# Patient Record
Sex: Male | Born: 1955
Health system: Southern US, Community
[De-identification: ages and names within clinical notes are randomized; demographics above are authoritative.]

## PROBLEM LIST (undated history)

## (undated) DIAGNOSIS — I714 Abdominal aortic aneurysm, without rupture, unspecified: Secondary | ICD-10-CM

## (undated) DIAGNOSIS — E7800 Pure hypercholesterolemia, unspecified: Secondary | ICD-10-CM

## (undated) DIAGNOSIS — I10 Essential (primary) hypertension: Secondary | ICD-10-CM

## (undated) HISTORY — PX: EYE SURGERY: SHX253

---

## 2015-08-15 ENCOUNTER — Ambulatory Visit
Admission: RE | Admit: 2015-08-15 | Discharge: 2015-08-15 | Disposition: A | Discharge status: Home / Self Care | Payer: 59 | Source: Ambulatory Visit | Attending: Unknown Physician Specialty | Admitting: Unknown Physician Specialty

## 2015-08-15 ENCOUNTER — Other Ambulatory Visit: Payer: Self-pay | Admitting: Unknown Physician Specialty

## 2015-08-15 DIAGNOSIS — K8020 Calculus of gallbladder without cholecystitis without obstruction: Secondary | ICD-10-CM | POA: Diagnosis not present

## 2015-08-15 DIAGNOSIS — R1032 Left lower quadrant pain: Secondary | ICD-10-CM | POA: Diagnosis present

## 2015-08-15 DIAGNOSIS — I714 Abdominal aortic aneurysm, without rupture: Secondary | ICD-10-CM | POA: Insufficient documentation

## 2015-10-29 ENCOUNTER — Emergency Department
Admission: EM | Admit: 2015-10-29 | Discharge: 2015-10-29 | Disposition: A | Discharge status: Home / Self Care | Payer: 59 | Attending: Emergency Medicine | Admitting: Emergency Medicine

## 2015-10-29 ENCOUNTER — Encounter: Payer: Self-pay | Admitting: Emergency Medicine

## 2015-10-29 DIAGNOSIS — R1030 Lower abdominal pain, unspecified: Secondary | ICD-10-CM | POA: Diagnosis not present

## 2015-10-29 DIAGNOSIS — Z87891 Personal history of nicotine dependence: Secondary | ICD-10-CM | POA: Insufficient documentation

## 2015-10-29 DIAGNOSIS — R300 Dysuria: Secondary | ICD-10-CM | POA: Insufficient documentation

## 2015-10-29 DIAGNOSIS — R319 Hematuria, unspecified: Secondary | ICD-10-CM | POA: Insufficient documentation

## 2015-10-29 DIAGNOSIS — R34 Anuria and oliguria: Secondary | ICD-10-CM | POA: Diagnosis not present

## 2015-10-29 DIAGNOSIS — R3915 Urgency of urination: Secondary | ICD-10-CM | POA: Diagnosis not present

## 2015-10-29 DIAGNOSIS — G8918 Other acute postprocedural pain: Secondary | ICD-10-CM | POA: Insufficient documentation

## 2015-10-29 DIAGNOSIS — R3911 Hesitancy of micturition: Secondary | ICD-10-CM | POA: Insufficient documentation

## 2015-10-29 HISTORY — DX: Pure hypercholesterolemia, unspecified: E78.00

## 2015-10-29 LAB — URINALYSIS COMPLETE WITH MICROSCOPIC (ARMC ONLY)
BACTERIA UA: NONE SEEN
Specific Gravity, Urine: 1.028 (ref 1.005–1.030)
pH: UNDETERMINED (ref 5.0–8.0)

## 2015-10-29 MED ORDER — CIPROFLOXACIN HCL 500 MG PO TABS: 500.0000 mg | ORAL_TABLET | Freq: Two times a day (BID) | ORAL | Status: AC

## 2015-10-29 NOTE — ED Notes (Signed)
Patient is POD 1 from hernia repair surgery, during which he required a urinary catheter, which was removed and patient was discharged to home.  This morning patient reports malodorous urine and then experienced hematuria, frequency and burning on urination.

## 2015-10-29 NOTE — Discharge Instructions (Signed)
Dysuria Dysuria is pain or discomfort while urinating. The pain or discomfort may be felt in the tube that carries urine out of the bladder (urethra) or in the surrounding tissue of the genitals. The pain may also be felt in the groin area, lower abdomen, and lower back. You may have to urinate frequently or have the sudden feeling that you have to urinate (urgency). Dysuria can affect both men and women, but is more common in women. Dysuria can be caused by many different things, including:  Urinary tract infection in women.  Infection of the kidney or bladder.  Kidney stones or bladder stones.  Certain sexually transmitted infections (STIs), such as chlamydia.  Dehydration.  Inflammation of the vagina.  Use of certain medicines.  Use of certain soaps or scented products that cause irritation. HOME CARE INSTRUCTIONS Watch your dysuria for any changes. The following actions may help to reduce any discomfort you are feeling:  Drink enough fluid to keep your urine clear or pale yellow.  Empty your bladder often. Avoid holding urine for long periods of time.  After a bowel movement or urination, women should cleanse from front to back, using each tissue only once.  Empty your bladder after sexual intercourse.  Take medicines only as directed by your health care provider.  If you were prescribed an antibiotic medicine, finish it all even if you start to feel better.  Avoid caffeine, tea, and alcohol. They can irritate the bladder and make dysuria worse. In men, alcohol may irritate the prostate.  Keep all follow-up visits as directed by your health care provider. This is important.  If you had any tests done to find the cause of dysuria, it is your responsibility to obtain your test results. Ask the lab or department performing the test when and how you will get your results. Talk with your health care provider if you have any questions about your results. SEEK MEDICAL CARE  IF:  You develop pain in your back or sides.  You have a fever.  You have nausea or vomiting.  You have blood in your urine.  You are not urinating as often as you usually do. SEEK IMMEDIATE MEDICAL CARE IF:  You pain is severe and not relieved with medicines.  You are unable to hold down any fluids.  You or someone else notices a change in your mental function.  You have a rapid heartbeat at rest.  You have shaking or chills.  You feel extremely weak.   This information is not intended to replace advice given to you by your health care provider. Make sure you discuss any questions you have with your health care provider.   Document Released: 06/25/2004 Document Revised: 10/18/2014 Document Reviewed: 05/23/2014 Elsevier Interactive Patient Education 2016 [employer]  Hematuria, Adult Hematuria is blood in your urine. It can be caused by a bladder infection, kidney infection, prostate infection, kidney stone, or cancer of your urinary tract. Infections can usually be treated with medicine, and a kidney stone usually will pass through your urine. If neither of these is the cause of your hematuria, further workup to find out the reason may be needed. It is very important that you tell your health care provider about any blood you see in your urine, even if the blood stops without treatment or happens without causing pain. Blood in your urine that happens and then stops and then happens again can be a symptom of a very serious condition. Also, pain is not a  symptom in the initial stages of many urinary cancers. HOME CARE INSTRUCTIONS   Drink lots of fluid, 3-4 quarts a day. If you have been diagnosed with an infection, cranberry juice is especially recommended, in addition to large amounts of water.  Avoid caffeine, tea, and carbonated beverages because they tend to irritate the bladder.  Avoid alcohol because it may irritate the prostate.  Take all medicines as directed by  your health care provider.  If you were prescribed an antibiotic medicine, finish it all even if you start to feel better.  If you have been diagnosed with a kidney stone, follow your health care provider's instructions regarding straining your urine to catch the stone.  Empty your bladder often. Avoid holding urine for long periods of time.  After a bowel movement, women should cleanse front to back. Use each tissue only once.  Empty your bladder before and after sexual intercourse if you are a male. SEEK MEDICAL CARE IF:  You develop back pain.  You have a fever.  You have a feeling of sickness in your stomach (nausea) or vomiting.  Your symptoms are not better in 3 days. Return sooner if you are getting worse. SEEK IMMEDIATE MEDICAL CARE IF:   You develop severe vomiting and are unable to keep the medicine down.  You develop severe back or abdominal pain despite taking your medicines.  You begin passing a large amount of blood or clots in your urine.  You feel extremely weak or faint, or you pass out. MAKE SURE YOU:   Understand these instructions.  Will watch your condition.  Will get help right away if you are not doing well or get worse.   This information is not intended to replace advice given to you by your health care provider. Make sure you discuss any questions you have with your health care provider.   Document Released: 09/27/2005 Document Revised: 10/18/2014 Document Reviewed: 05/28/2013 Elsevier Interactive Patient Education 2016 [employer].   Please follow up with your PCP within 48 hours to recheck your urine to ensure improvement. Please keep your surgeon informed of your progress.   If you are unable to produce urine, have onset of fevers, chills, pain, please return to the Emergency Department immediately.

## 2015-10-29 NOTE — ED Notes (Signed)
Pt c/o dark red blood in urine.  Pt sts that he had hernia surgery yesterday at [facility], was producing urine w/ catheter.  Pt sts he was having no trouble urinating until this afternoon.  Pt sts that he feels like he has produced v little urine and has had burning w/ urination.

## 2015-10-29 NOTE — ED Provider Notes (Signed)
CSN: [id number]     Arrival date & time 10/29/15  1605 History   First MD Initiated Contact with Patient 10/29/15 1712     Chief Complaint  Patient presents with  . Hematuria     (Consider location/radiation/quality/duration/timing/severity/associated sxs/prior Treatment) HPI Comments: This is a WDWN 60yo male, NAD. He is accompanied by his wife who assists with history. Notes he is post operative day 1 from a hernia repair completed at [facility]. Was catheterized for the surgery and notes removal was without compromise prior to being discharged. Had painful urination with some urgency and hesitancy onset as of 2pm today. Some lower abdominal discomfort. Notes blood in the urine earlier today. Denies penile discharge, skin lesions. No back pain. Is in some discomfort from the surgery. Denies abdominal distension, nausea, vomiting. No fevers, chills, weakness.   Patient is a 60 y.o. male presenting with hematuria. The history is provided by the patient.  Hematuria This is a new problem. The current episode started today. The problem occurs intermittently. Associated symptoms include urinary symptoms. Pertinent negatives include no abdominal pain, chest pain, chills, fever, headaches, nausea, rash or vomiting. Nothing aggravates the symptoms. He has tried nothing for the symptoms.    Past Medical History  Diagnosis Date  . Hypercholesterolemia    Past Surgical History  Procedure Laterality Date  . Eye surgery     History reviewed. No pertinent family history. Social History  Substance Use Topics  . Smoking status: Former Smoker    Quit date: 10/28/2010  . Smokeless tobacco: None  . Alcohol Use: No    Review of Systems  Constitutional: Negative for fever, chills and appetite change.  Respiratory: Negative for chest tightness and shortness of breath.   Cardiovascular: Negative for chest pain and leg swelling.  Gastrointestinal: Negative for nausea, vomiting and abdominal pain.   Genitourinary: Positive for dysuria, urgency, hematuria, decreased urine volume and difficulty urinating. Negative for frequency, flank pain, discharge, penile swelling, genital sores, penile pain and testicular pain.  Musculoskeletal: Negative for back pain.  Skin: Negative for rash.  Neurological: Negative for dizziness and headaches.      Allergies  Penicillins  Home Medications   Prior to Admission medications   Not on File   BP 129/66 mmHg  Pulse 67  Temp(Src) 97.6 F (36.4 C) (Oral)  Resp 18  Ht  (1.676 m)  Wt 76.204 kg  BMI 27.13 kg/m2  SpO2 97% Physical Exam  Constitutional: He appears well-developed and well-nourished. No distress.  HENT:  Head: Normocephalic.  Eyes: Conjunctivae are normal.  Neck: Normal range of motion. Neck supple.  Cardiovascular: Normal rate, regular rhythm and normal heart sounds.  Exam reveals no gallop and no friction rub.   No murmur heard. Pulmonary/Chest: Effort normal and breath sounds normal. No respiratory distress. He has no wheezes. He has no rales.  Abdominal: Soft. Bowel sounds are normal. He exhibits no distension and no mass. There is tenderness (suprapubic tenderness and about surgical port sites) in the suprapubic area. There is no rigidity, no rebound, no guarding and no CVA tenderness.  3 horizontal surgical wounds on the abdomen, well healing without erythema, warmth - mild tenderness to palpation  Lymphadenopathy:    He has no cervical adenopathy.  Neurological: He is alert.  Skin: Skin is warm and dry.  Psychiatric: He has a normal mood and affect.    ED Course  Procedures (including critical care time) Labs Review Labs Reviewed  URINALYSIS COMPLETEWITH MICROSCOPIC [facility] ONLY) -  Abnormal; Notable for the following:    Color, Urine AMBER (*)    APPearance CLOUDY (*)    Glucose, UA   (*)    Value: TEST NOT REPORTED DUE TO COLOR INTERFERENCE OF URINE PIGMENT   Bilirubin Urine   (*)    Value: TEST NOT  REPORTED DUE TO COLOR INTERFERENCE OF URINE PIGMENT   Ketones, ur   (*)    Value: TEST NOT REPORTED DUE TO COLOR INTERFERENCE OF URINE PIGMENT   Hgb urine dipstick   (*)    Value: TEST NOT REPORTED DUE TO COLOR INTERFERENCE OF URINE PIGMENT   Protein, ur   (*)    Value: TEST NOT REPORTED DUE TO COLOR INTERFERENCE OF URINE PIGMENT   Nitrite   (*)    Value: TEST NOT REPORTED DUE TO COLOR INTERFERENCE OF URINE PIGMENT   Leukocytes, UA   (*)    Value: TEST NOT REPORTED DUE TO COLOR INTERFERENCE OF URINE PIGMENT   Squamous Epithelial / LPF 0-5 (*)    All other components within normal limits  URINE CULTURE    Imaging Review Bladder scan completed in triage prior to seeing the patient was noted to be ZERO after voiding. Melonie Florida, PA-C 10/29/15 1747  Governor Rooks, MD 10/29/15 2219

## 2015-11-01 LAB — URINE CULTURE: Culture: >=100000

## 2016-04-30 IMAGING — CT CT ABD-PELV W/O CM
2 of 4 series · 16 of 46 positions shown, 18 images · non-contrast
Comparison: None.

CLINICAL DATA: Left lower quadrant abdominal pain and left flank
pain.

EXAM:
CT ABDOMEN AND PELVIS WITHOUT CONTRAST
TECHNIQUE: Multidetector CT imaging of the abdomen and pelvis was performed
following the standard protocol without IV contrast.

[Series 2: routine without · axial · non-contrast · 0.72mm/px · z∈[-1088,-668]mm · 13 of 92 slices shown, 15 images]
[im 4/92  soft-tissue]
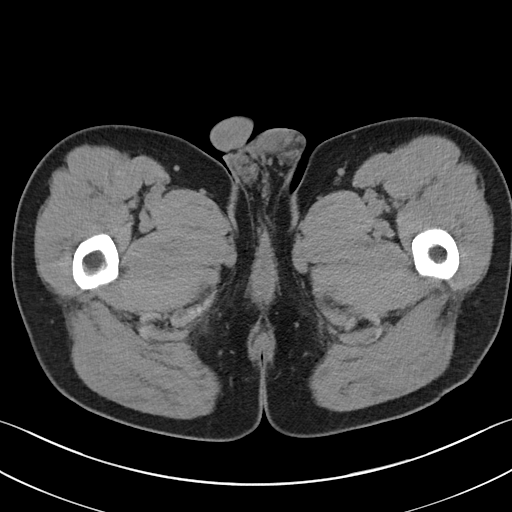
[im 4/92  bone]
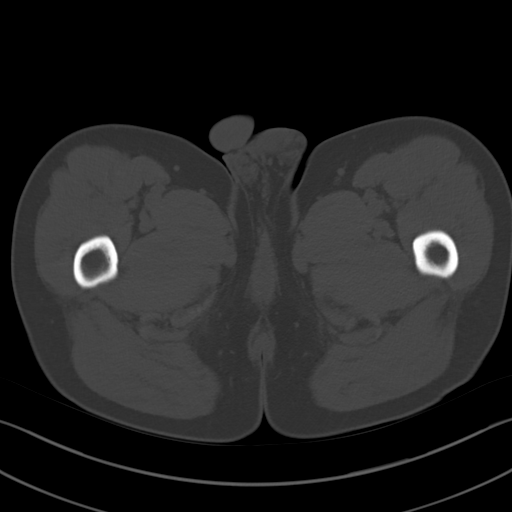
[im 11/92  soft-tissue]
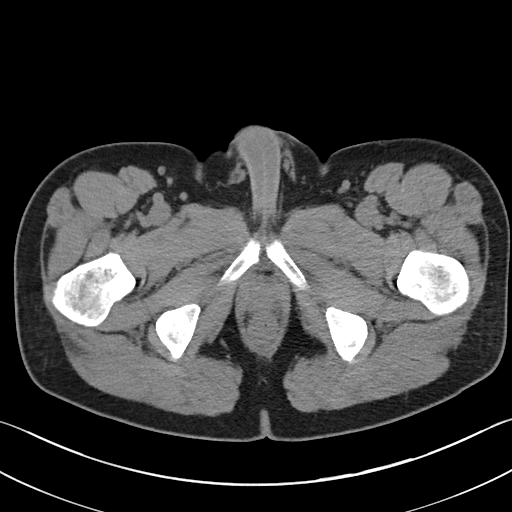
[im 19/92  soft-tissue]
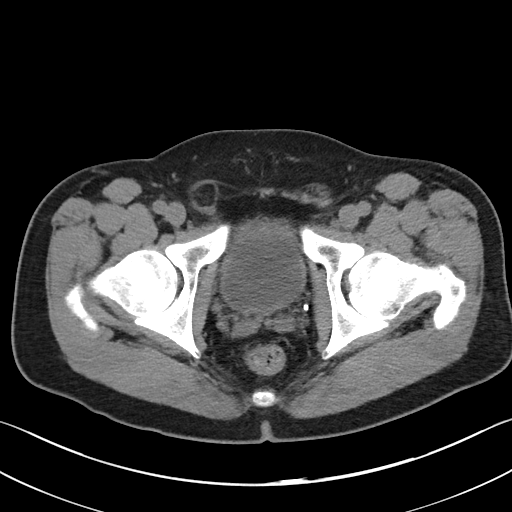
[im 26/92  soft-tissue]
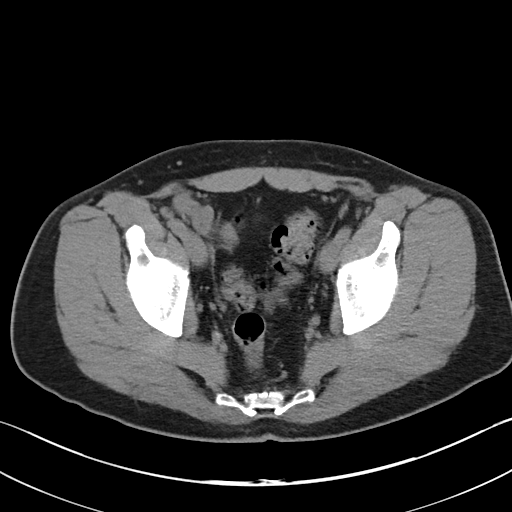
[im 33/92  soft-tissue]
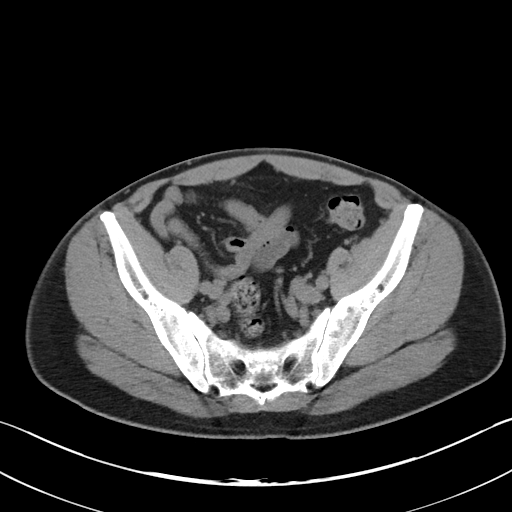
[im 41/92  soft-tissue]
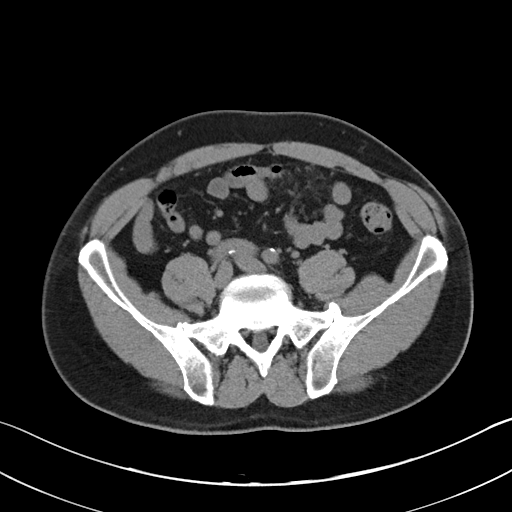
[im 48/92  soft-tissue]
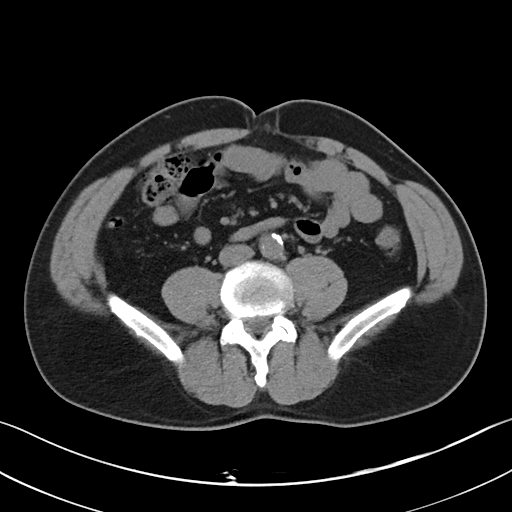
[im 51/92  soft-tissue]
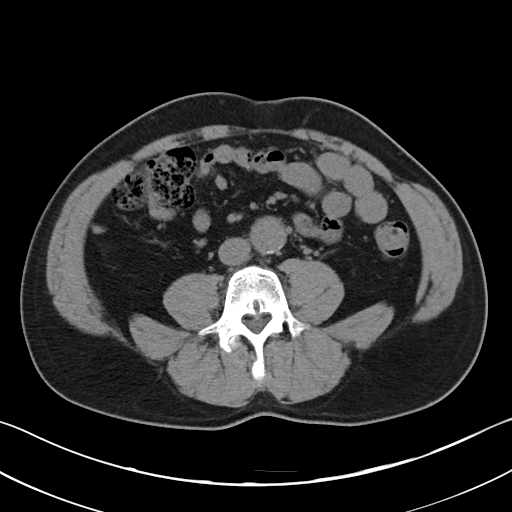
[im 59/92  soft-tissue]
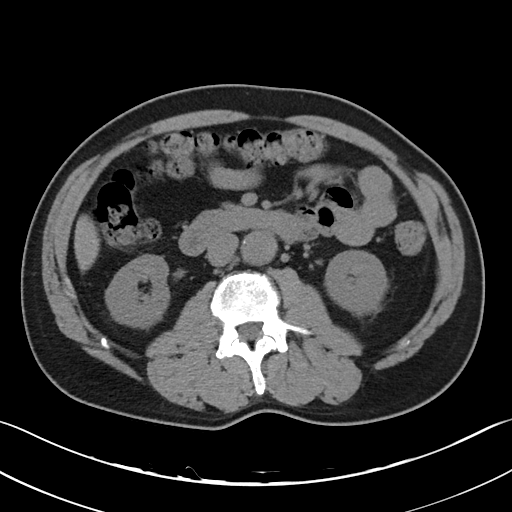
[im 59/92  bone]
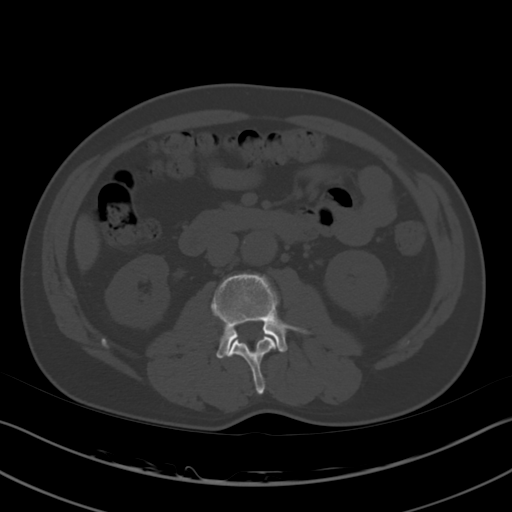
[im 66/92  soft-tissue]
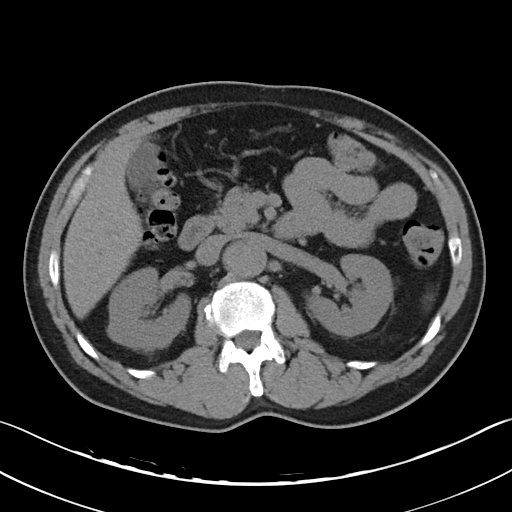
[im 73/92  soft-tissue]
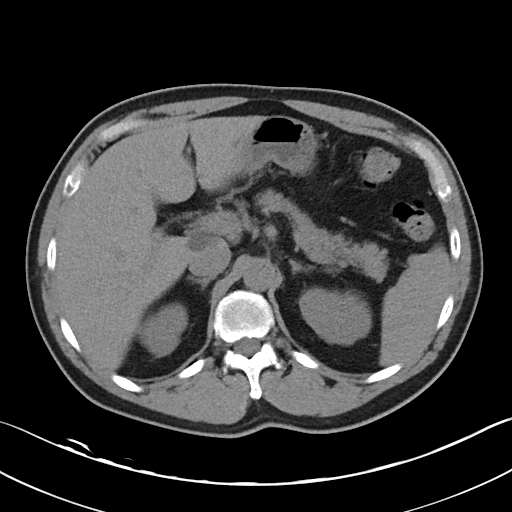
[im 81/92  soft-tissue]
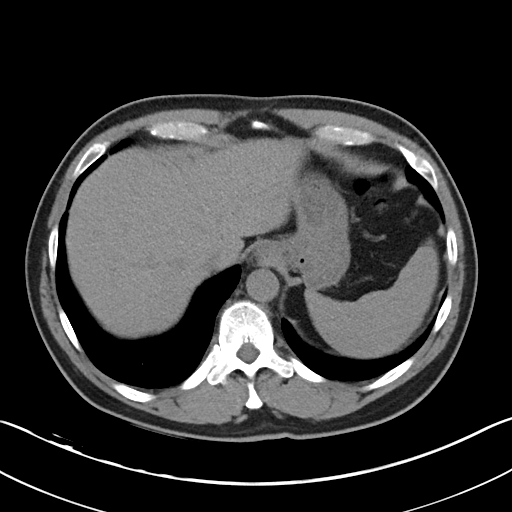
[im 88/92  soft-tissue]
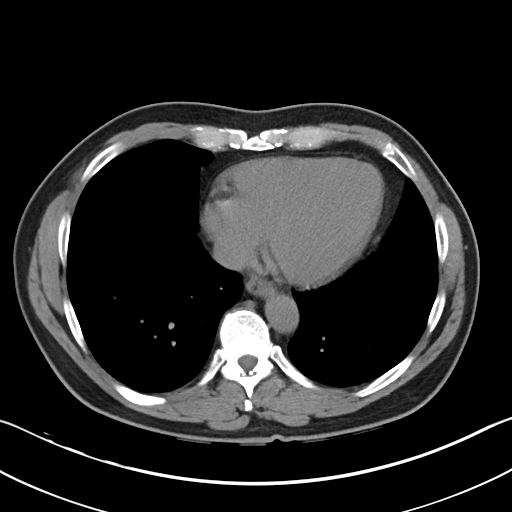

[Series 5: cor routine without · coronal · non-contrast · 0.72mm/px · 3 of 133 slices shown]
[im 45/133  soft-tissue]
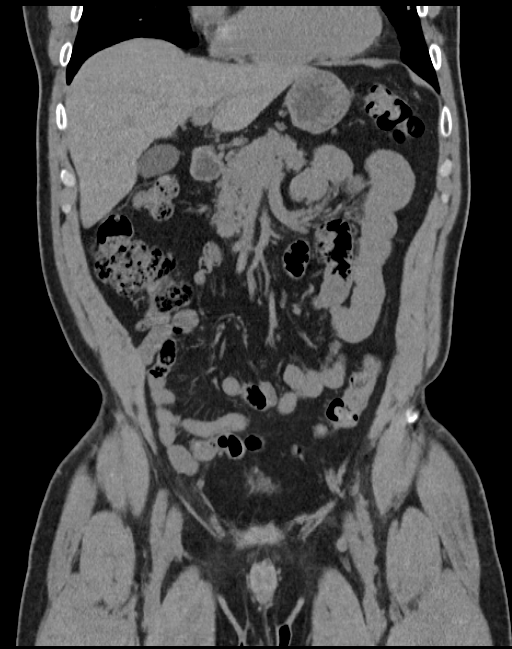
[im 59/133  soft-tissue]
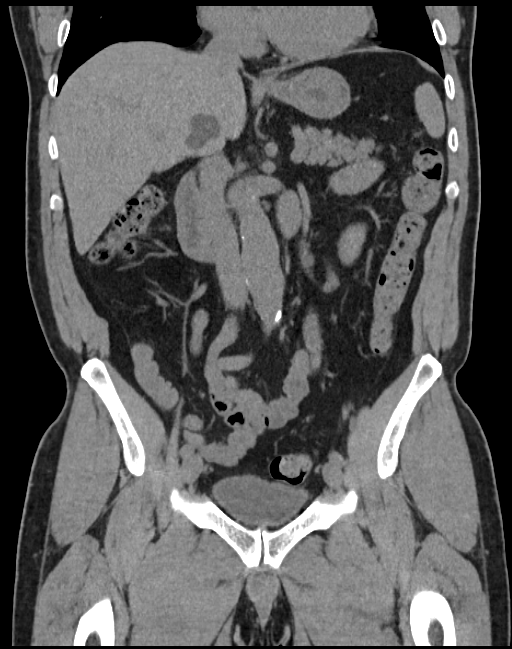
[im 74/133  soft-tissue]
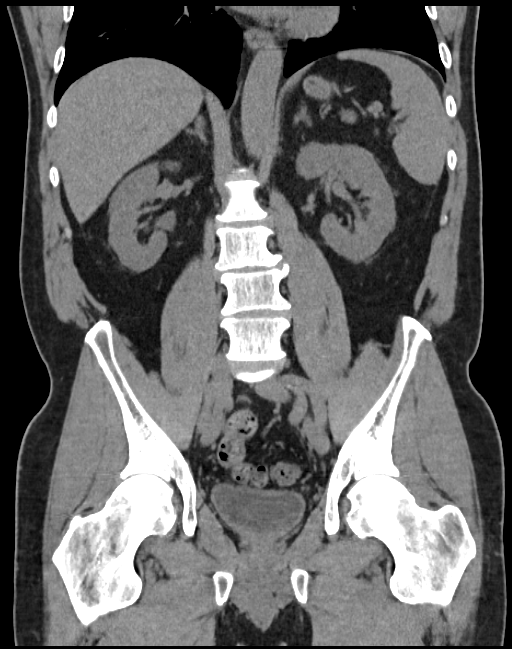

[16 of 46 positions shown; findings below may reference images not displayed]

FINDINGS: No evidence of urinary tract calculi or hydronephrosis. The bladder
is unremarkable. A single calcified gallstone is present in the
gallbladder lumen. No associated gallbladder distention or
surrounding inflammation. Bile ducts are nondilated. Unenhanced
appearance of the liver, pancreas, spleen, adrenal glands and
kidneys are unremarkable.

Bowel shows no evidence of obstruction or inflammation. The appendix
is normal. Sigmoid colon diverticulosis noted without
diverticulitis. No free fluid or abscess identified. Small
right-sided inguinal hernia present containing fat. Mild
degenerative disc disease identified of the lumbar spine.

Mild aneurysmal disease of the abdominal aorta is present. Initial
aneurysmal dilatation begins immediately below the renal arteries
and is slightly eccentric towards the right with maximal caliber of
approximately 3.1 x 3.2 cm. The distal aorta just below the origin
of the inferior mesenteric artery measures 3.4 cm in greatest AP
diameter and 3.0 cm in transverse width. The iliac arteries are
tortuous bilaterally.
IMPRESSION: 1. No evidence of urinary tract calculi or hydronephrosis.
2. Cholelithiasis with single calcified gallstone identified. No
evidence of cholecystitis or biliary obstruction by CT.
3. Mild aneurysmal disease of the infrarenal abdominal aorta with 2
areas of aneurysmal dilatation identified. Maximal diameter of the
aneurysmal aorta is 3.4 cm. Vascular surgery follow-up is
recommended. Recommend followup by ultrasound in 2 years. This
recommendation follows ACR consensus guidelines: White Paper of the
ACR Incidental Findings Committee II on Vascular Findings. [HOSPITAL] 3433; [DATE].

## 2017-01-05 DIAGNOSIS — E7800 Pure hypercholesterolemia, unspecified: Secondary | ICD-10-CM | POA: Diagnosis not present

## 2017-01-05 DIAGNOSIS — Z8249 Family history of ischemic heart disease and other diseases of the circulatory system: Secondary | ICD-10-CM | POA: Diagnosis not present

## 2017-04-22 DIAGNOSIS — L255 Unspecified contact dermatitis due to plants, except food: Secondary | ICD-10-CM | POA: Diagnosis not present

## 2017-04-26 DIAGNOSIS — L259 Unspecified contact dermatitis, unspecified cause: Secondary | ICD-10-CM | POA: Diagnosis not present

## 2017-08-26 DIAGNOSIS — E7800 Pure hypercholesterolemia, unspecified: Secondary | ICD-10-CM | POA: Diagnosis not present

## 2017-08-26 DIAGNOSIS — Z0000 Encounter for general adult medical examination without abnormal findings: Secondary | ICD-10-CM | POA: Diagnosis not present

## 2018-07-31 ENCOUNTER — Other Ambulatory Visit: Payer: Self-pay | Admitting: Physician Assistant

## 2018-07-31 DIAGNOSIS — I7143 Infrarenal abdominal aortic aneurysm, without rupture: Secondary | ICD-10-CM

## 2018-07-31 DIAGNOSIS — I714 Abdominal aortic aneurysm, without rupture: Secondary | ICD-10-CM

## 2018-08-22 ENCOUNTER — Ambulatory Visit
Admission: RE | Admit: 2018-08-22 | Discharge: 2018-08-22 | Disposition: A | Discharge status: Home / Self Care | Payer: Managed Care, Other (non HMO) | Source: Ambulatory Visit | Attending: Physician Assistant | Admitting: Physician Assistant

## 2018-08-22 DIAGNOSIS — I7143 Infrarenal abdominal aortic aneurysm, without rupture: Secondary | ICD-10-CM

## 2018-08-22 DIAGNOSIS — I714 Abdominal aortic aneurysm, without rupture: Secondary | ICD-10-CM

## 2018-10-12 DIAGNOSIS — M25531 Pain in right wrist: Secondary | ICD-10-CM | POA: Diagnosis not present

## 2018-10-12 DIAGNOSIS — Z4789 Encounter for other orthopedic aftercare: Secondary | ICD-10-CM | POA: Diagnosis not present

## 2018-10-12 DIAGNOSIS — M79641 Pain in right hand: Secondary | ICD-10-CM | POA: Diagnosis not present

## 2018-10-23 DIAGNOSIS — M65841 Other synovitis and tenosynovitis, right hand: Secondary | ICD-10-CM | POA: Diagnosis not present

## 2018-10-23 DIAGNOSIS — W11XXXD Fall on and from ladder, subsequent encounter: Secondary | ICD-10-CM | POA: Diagnosis not present

## 2018-10-23 DIAGNOSIS — S66811D Strain of other specified muscles, fascia and tendons at wrist and hand level, right hand, subsequent encounter: Secondary | ICD-10-CM | POA: Diagnosis not present

## 2018-10-26 DIAGNOSIS — Y92009 Unspecified place in unspecified non-institutional (private) residence as the place of occurrence of the external cause: Secondary | ICD-10-CM | POA: Diagnosis not present

## 2018-10-26 DIAGNOSIS — S66811D Strain of other specified muscles, fascia and tendons at wrist and hand level, right hand, subsequent encounter: Secondary | ICD-10-CM | POA: Diagnosis not present

## 2018-10-26 DIAGNOSIS — W11XXXD Fall on and from ladder, subsequent encounter: Secondary | ICD-10-CM | POA: Diagnosis not present

## 2018-11-15 DIAGNOSIS — M79641 Pain in right hand: Secondary | ICD-10-CM | POA: Diagnosis not present

## 2018-11-15 DIAGNOSIS — M25531 Pain in right wrist: Secondary | ICD-10-CM | POA: Diagnosis not present

## 2018-11-15 DIAGNOSIS — Z4789 Encounter for other orthopedic aftercare: Secondary | ICD-10-CM | POA: Diagnosis not present

## 2018-12-14 DIAGNOSIS — M25531 Pain in right wrist: Secondary | ICD-10-CM | POA: Diagnosis not present

## 2018-12-14 DIAGNOSIS — M79641 Pain in right hand: Secondary | ICD-10-CM | POA: Diagnosis not present

## 2018-12-14 DIAGNOSIS — Z4789 Encounter for other orthopedic aftercare: Secondary | ICD-10-CM | POA: Diagnosis not present

## 2018-12-26 DIAGNOSIS — S66811D Strain of other specified muscles, fascia and tendons at wrist and hand level, right hand, subsequent encounter: Secondary | ICD-10-CM | POA: Diagnosis not present

## 2018-12-26 DIAGNOSIS — M65331 Trigger finger, right middle finger: Secondary | ICD-10-CM | POA: Diagnosis not present

## 2018-12-26 DIAGNOSIS — W11XXXD Fall on and from ladder, subsequent encounter: Secondary | ICD-10-CM | POA: Diagnosis not present

## 2019-01-18 IMAGING — US US AORTA SCREENING (MEDICARE)
1 series · 14 of 25 positions shown · non-contrast
Comparison: 08/15/2015

CLINICAL DATA: Abdominal aortic aneurysm

EXAM:
ULTRASOUND OF ABDOMINAL AORTA
TECHNIQUE: Ultrasound examination of the abdominal aorta was performed to
evaluate for abdominal aortic aneurysm.

[Series 1: us aorta screening (medicare) · 0.26mm/px · 14 of 26 slices shown]
[im 1/26]
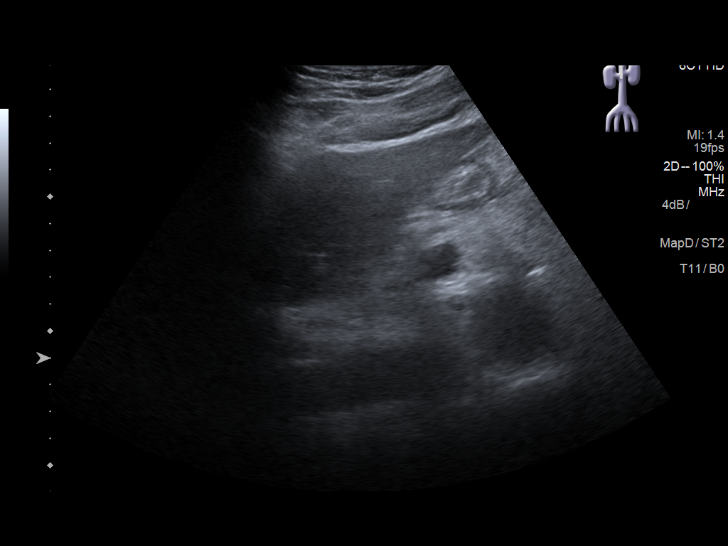
[im 3/26]
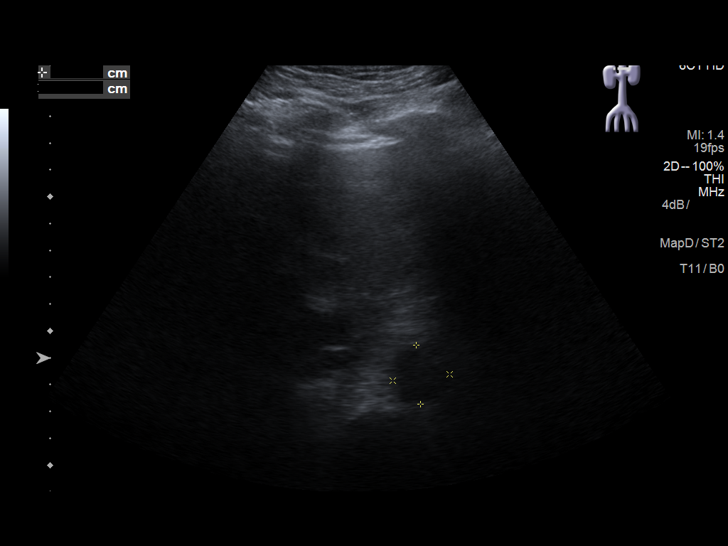
[im 5/26]
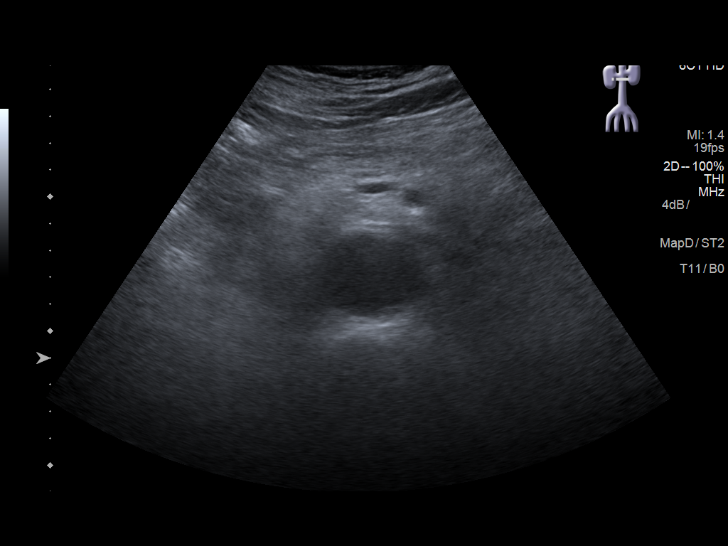
[im 7/26]
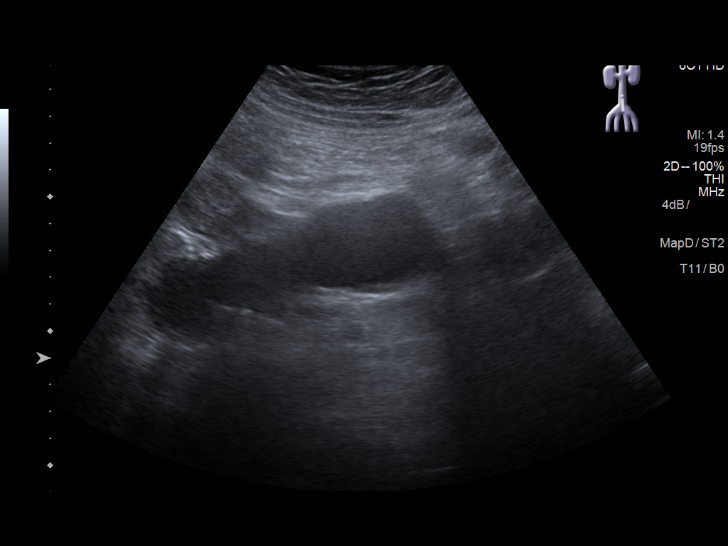
[im 9/26]
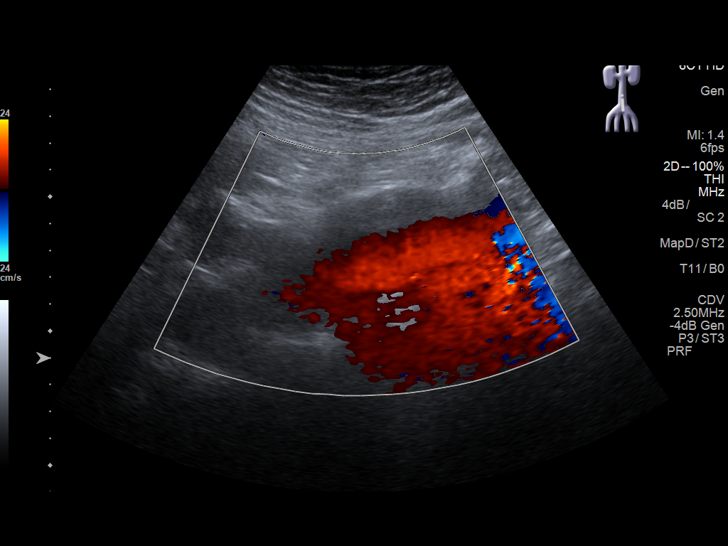
[im 10/26]
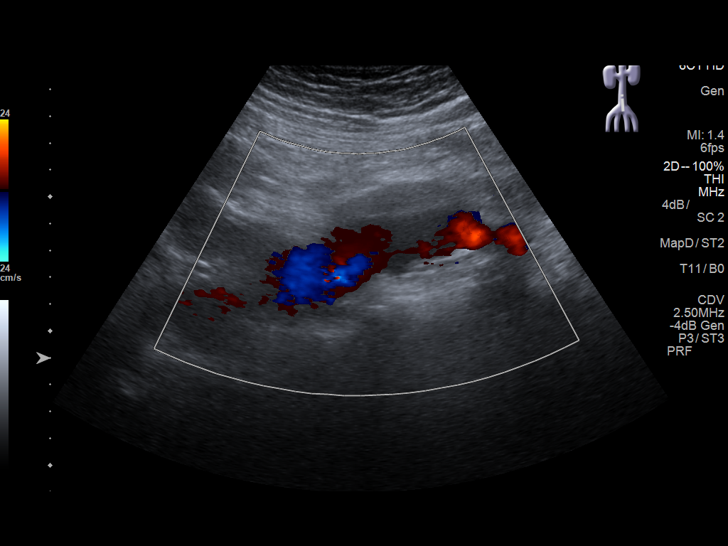
[im 12/26]
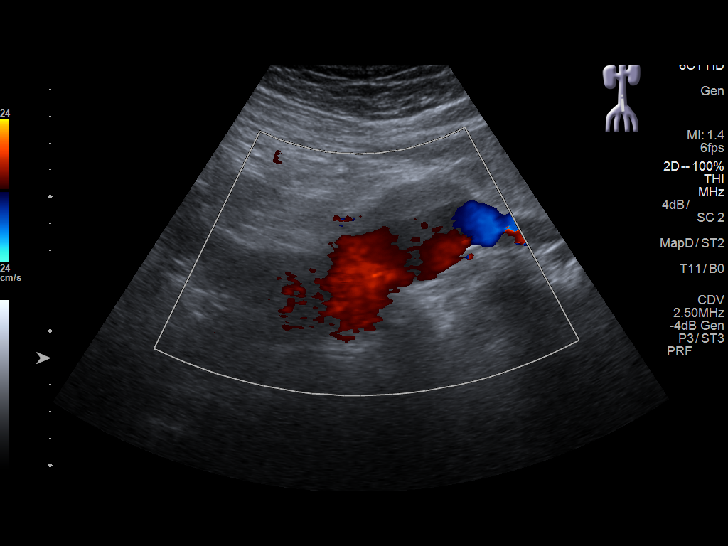
[im 14/26]
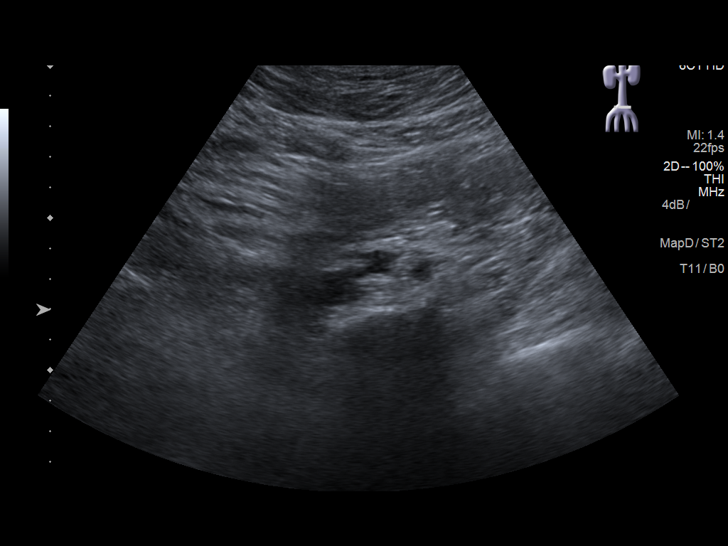
[im 16/26]
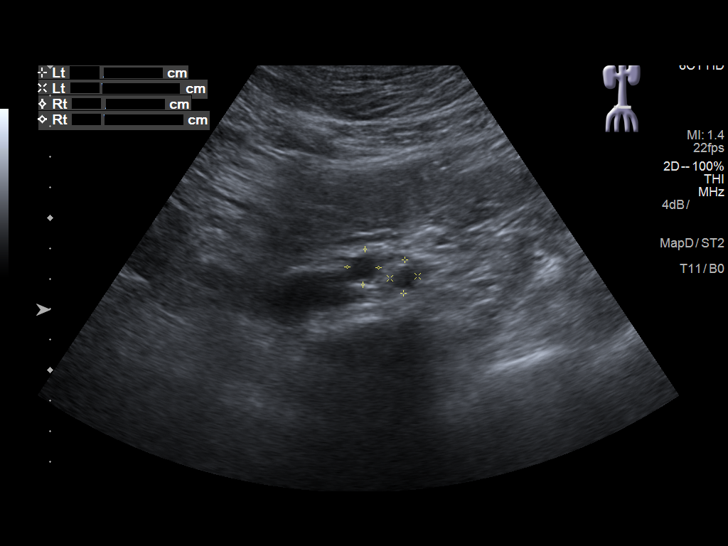
[im 17/26]
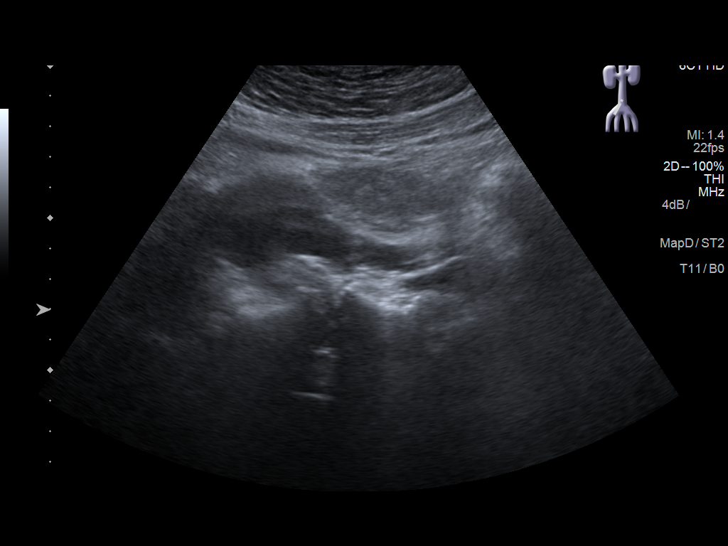
[im 19/26]
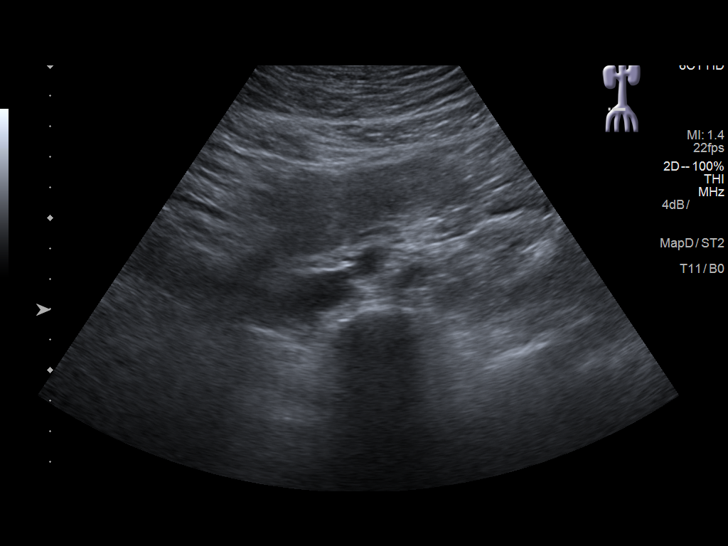
[im 21/26]
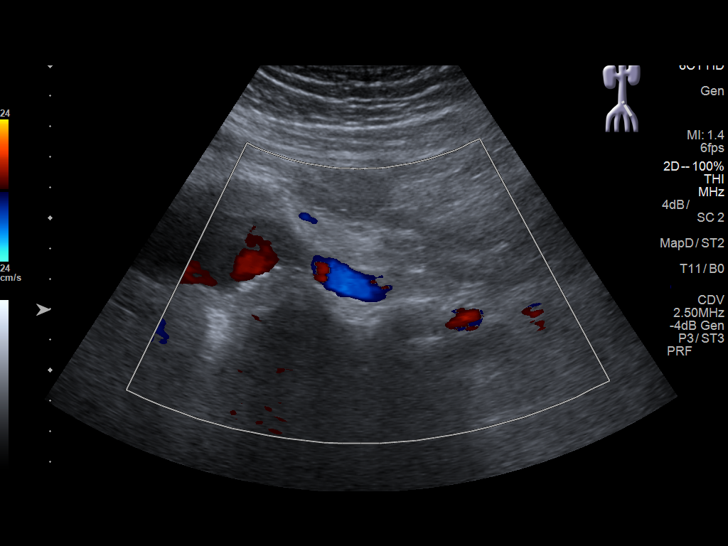
[im 23/26]
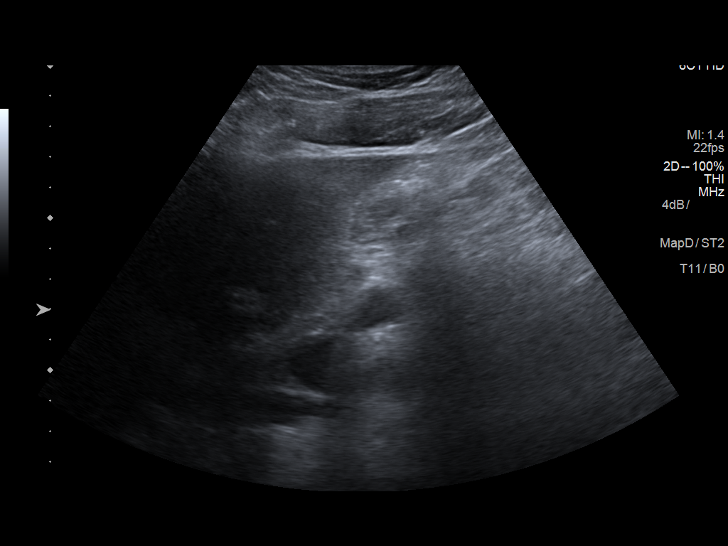
[im 26/26]
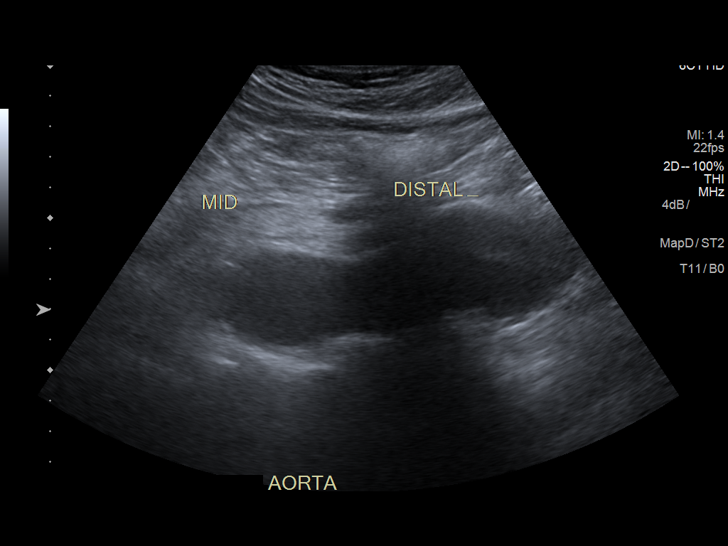

[14 of 25 positions shown; findings below may reference images not displayed]

FINDINGS: Abdominal aortic measurements as follows:

Proximal:  2.2 cm

Mid:  4.0 cm

Distal:  4.1 cm

Lobulated fusiform atherosclerotic aneurysm of the abdominal aorta
measuring 4.1 cm distally. Aneurysm extends to the bifurcation.
Iliac vessels are not involved.

Right iliac 1.2 cm

Left iliac 1.1 cm

Distal aorta velocity 57.3 centimeters/second
IMPRESSION: Lobulated fusiform atherosclerotic abdominal aortic aneurysm
measuring up to 4.1 cm distally.

Recommend followup by US in 1 year. This recommendation follows ACR
consensus guidelines: White Paper of the ACR Incidental Findings
Committee II on Vascular Findings. [HOSPITAL] 7562;

## 2019-01-24 DIAGNOSIS — E7800 Pure hypercholesterolemia, unspecified: Secondary | ICD-10-CM | POA: Diagnosis not present

## 2019-01-24 DIAGNOSIS — I714 Abdominal aortic aneurysm, without rupture: Secondary | ICD-10-CM | POA: Diagnosis not present

## 2019-02-02 DIAGNOSIS — I714 Abdominal aortic aneurysm, without rupture: Secondary | ICD-10-CM | POA: Diagnosis not present

## 2019-07-24 ENCOUNTER — Other Ambulatory Visit: Payer: Self-pay | Admitting: Family Medicine

## 2019-07-24 DIAGNOSIS — I714 Abdominal aortic aneurysm, without rupture, unspecified: Secondary | ICD-10-CM

## 2019-08-01 ENCOUNTER — Ambulatory Visit
Admission: RE | Admit: 2019-08-01 | Discharge: 2019-08-01 | Disposition: A | Discharge status: Home / Self Care | Payer: 59 | Source: Ambulatory Visit | Attending: Family Medicine | Admitting: Family Medicine

## 2019-08-01 ENCOUNTER — Other Ambulatory Visit: Payer: Self-pay

## 2019-08-01 ENCOUNTER — Ambulatory Visit: Payer: Managed Care, Other (non HMO)

## 2019-08-01 DIAGNOSIS — I714 Abdominal aortic aneurysm, without rupture, unspecified: Secondary | ICD-10-CM

## 2020-04-16 IMAGING — US US AORTA SCREENING (MEDICARE)
1 series · 13 of 25 positions shown · non-contrast
Comparison: Abdominal aortic ultrasound-08/22/2018; CT abdomen and
pelvis-08/15/2015

CLINICAL DATA: Aneurysm of the infrarenal abdominal aorta.

EXAM:
US ABDOMINAL AORTA MEDICARE SCREENING
TECHNIQUE: Ultrasound examination of the abdominal aorta was performed as a
screening evaluation for abdominal aortic aneurysm.

[Series 1: us aorta screening (medicare) · 0.26mm/px · 13 of 27 slices shown]
[im 1/27]
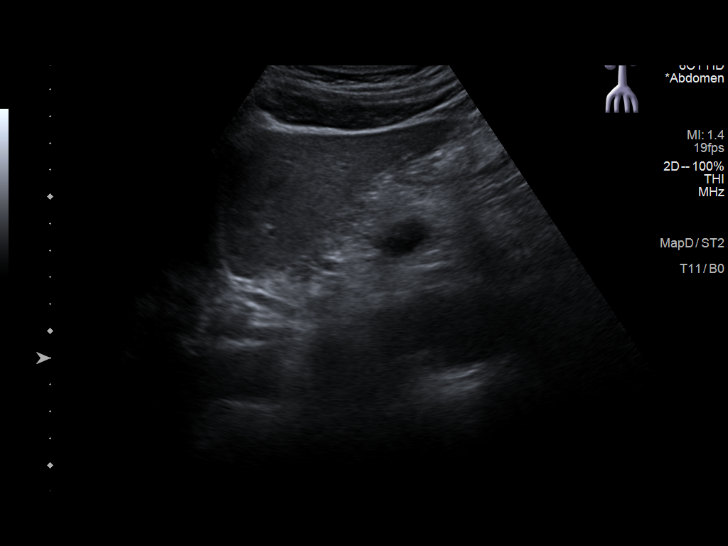
[im 3/27]
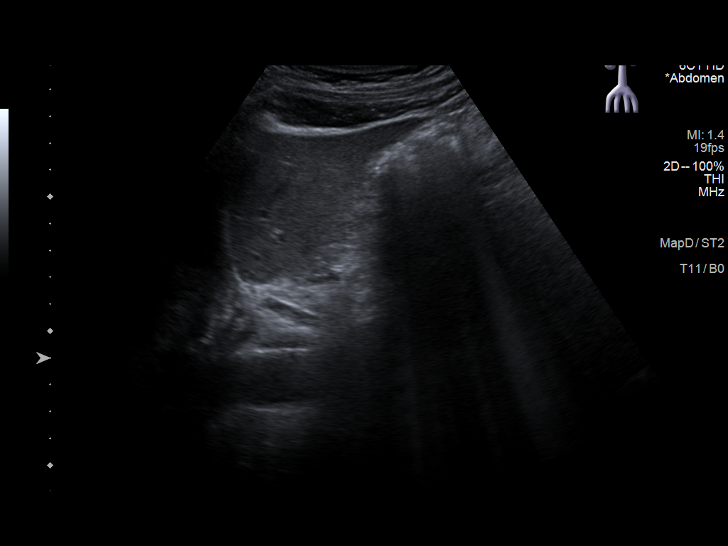
[im 5/27]
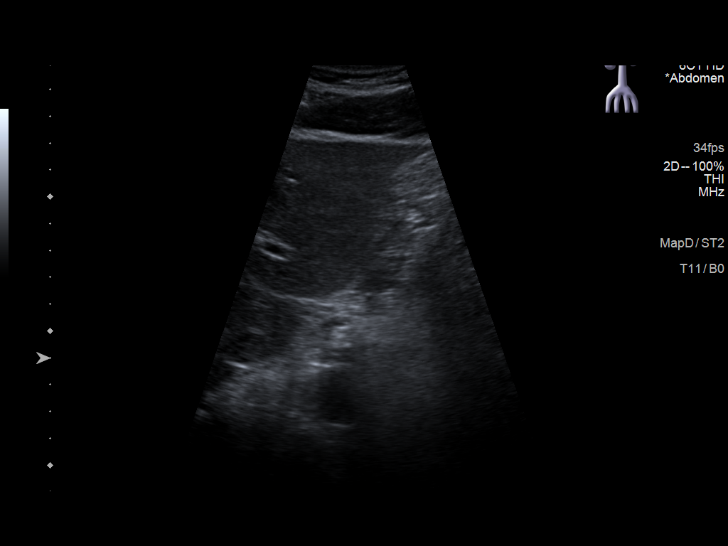
[im 7/27]
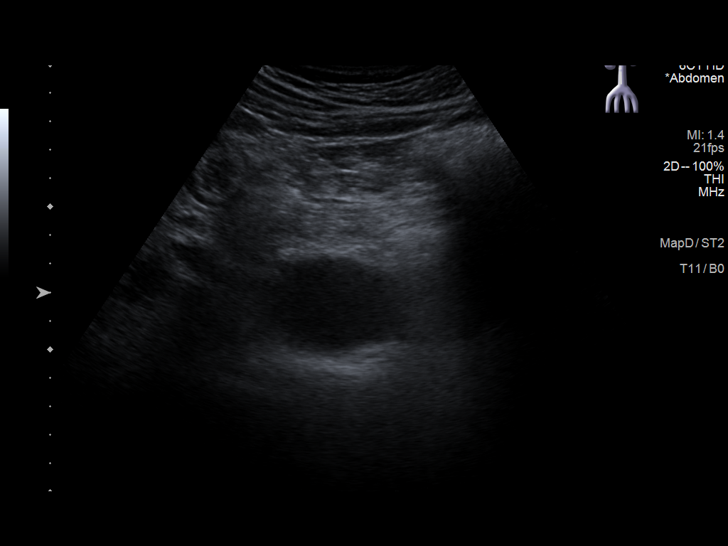
[im 9/27]
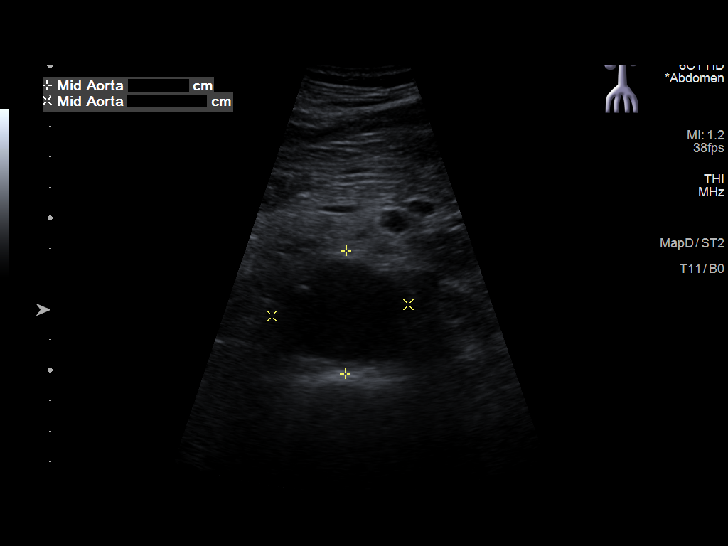
[im 11/27]
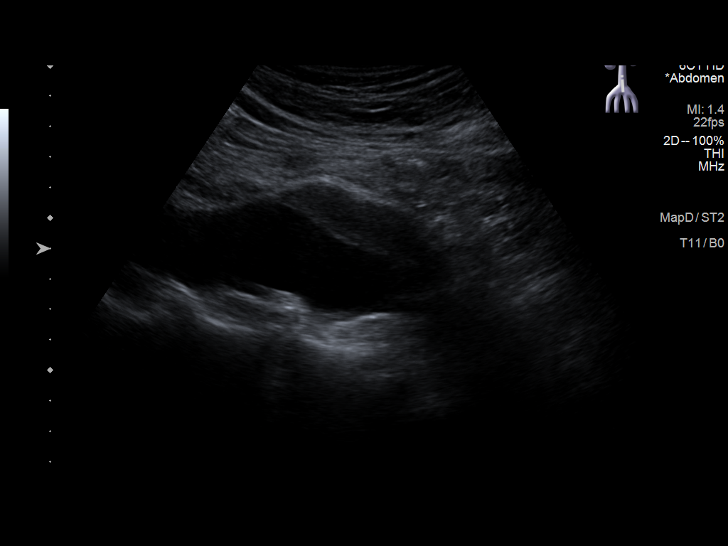
[im 14/27]
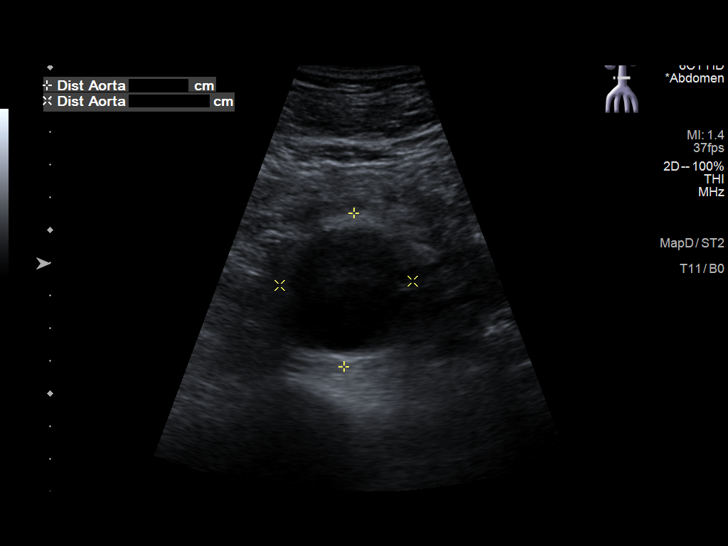
[im 16/27]
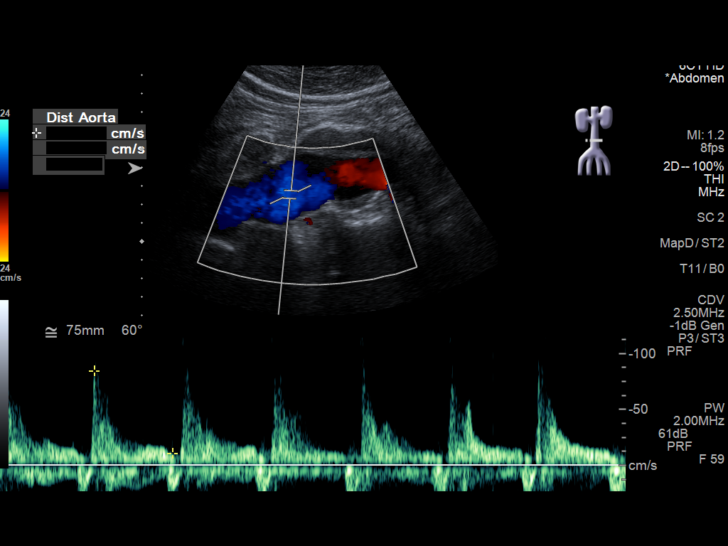
[im 18/27]
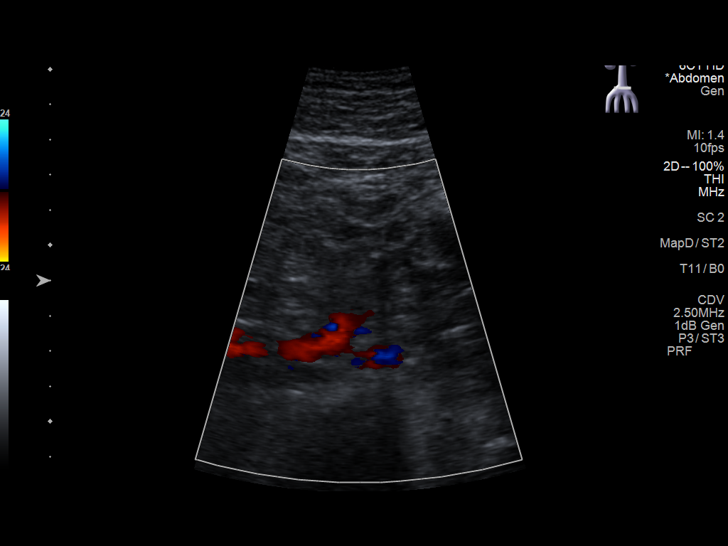
[im 20/27]
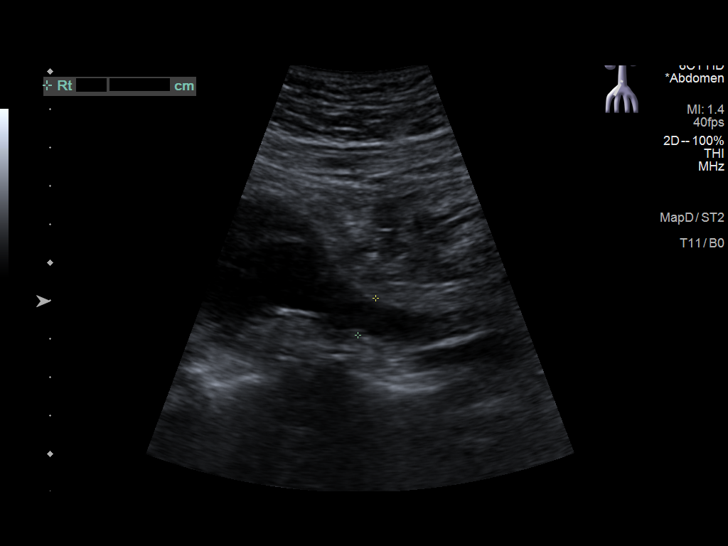
[im 22/27]
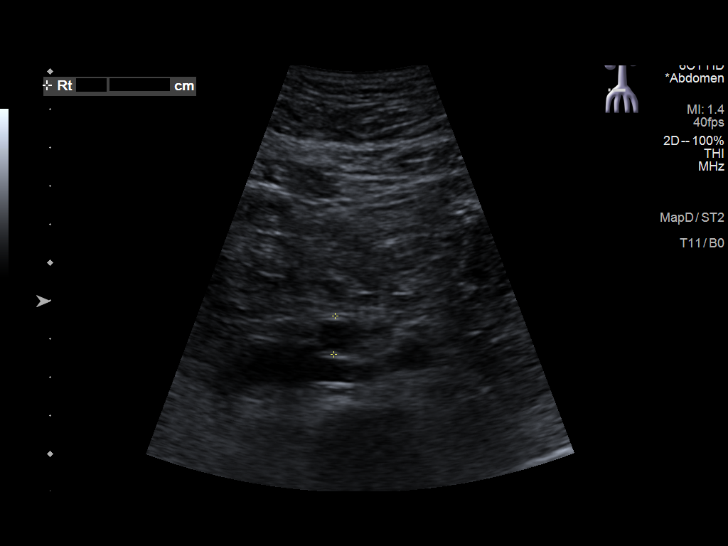
[im 24/27]
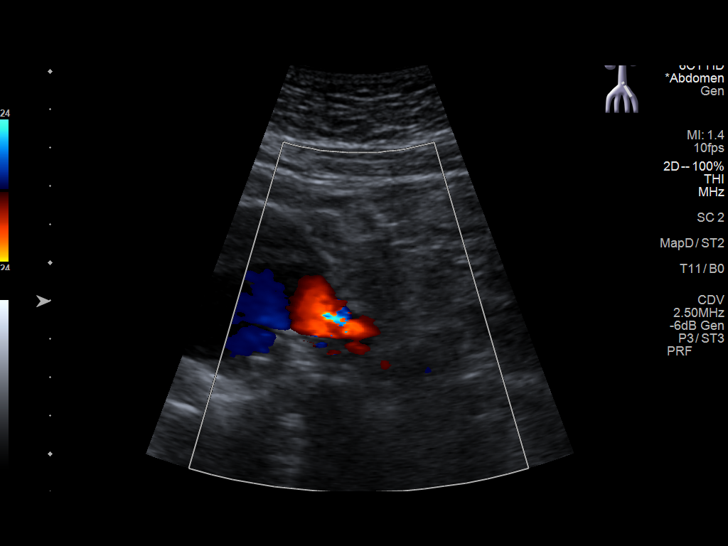
[im 27/27]
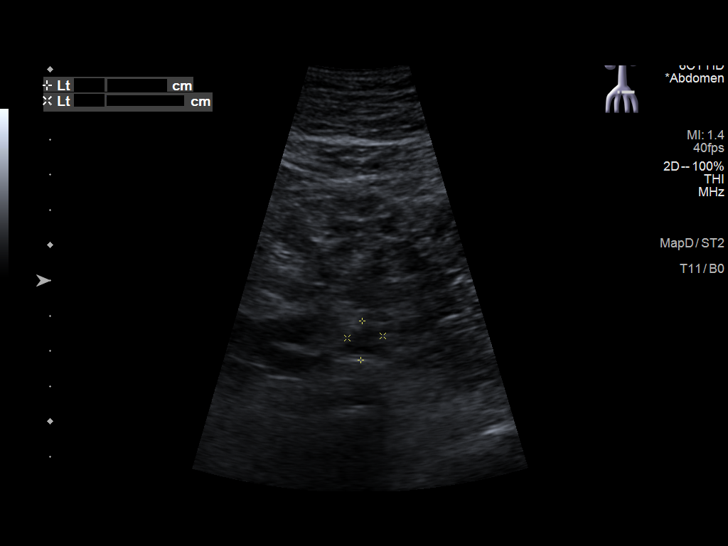

[13 of 25 positions shown; findings below may reference images not displayed]

FINDINGS: Abdominal aortic measurements as follows:

Proximal:  2.2 x 2.1 cm

Mid:  4.0 x 3.6 cm previously, 4.0 x 3.6 cm

Distal:  4.1 x 3.7 cm, previously, 4.1 x 3.7 cm

There is a moderate amount of crescentic hypoechoic thrombus within
the dominant component of the distal abdominal aortic aneurysm
(images 13 and 15).

Right common iliac artery: 1.2 x 1.0 cm

Left Common iliac artery: 1.1 x 0.9 cm
IMPRESSION: 1. Grossly unchanged aneurysmal dilatation of the mid and distal
aspects of the abdominal aorta measuring 4.1 cm in diameter,
unchanged compared to the [DATE] aortic ultrasound, though
increased in size compared to the 7804 abdominal CT, previously
cm in maximal diameter. Recommend follow-up aortic ultrasound in 1
year. This recommendation follows ACR consensus guidelines: White
Paper of the ACR Incidental Findings Committee II on Vascular
Findings. [HOSPITAL] 4159; [DATE].
2.  Aortic aneurysm NOS ([id number]).
3. Moderate amount of crescentic hypoechoic thrombus within the
dominant component of the abdominal aortic aneurysm, similar to the
7861 examination.

## 2022-01-06 ENCOUNTER — Other Ambulatory Visit: Payer: Self-pay

## 2022-01-06 ENCOUNTER — Emergency Department
Admission: EM | Admit: 2022-01-06 | Discharge: 2022-01-06 | Disposition: A | Discharge status: Home / Self Care | Payer: BC Managed Care – PPO | Attending: Emergency Medicine | Admitting: Emergency Medicine

## 2022-01-06 ENCOUNTER — Encounter: Payer: Self-pay | Admitting: *Deleted

## 2022-01-06 DIAGNOSIS — W260XXA Contact with knife, initial encounter: Secondary | ICD-10-CM | POA: Diagnosis not present

## 2022-01-06 DIAGNOSIS — S6992XA Unspecified injury of left wrist, hand and finger(s), initial encounter: Secondary | ICD-10-CM | POA: Diagnosis present

## 2022-01-06 DIAGNOSIS — S61412A Laceration without foreign body of left hand, initial encounter: Secondary | ICD-10-CM

## 2022-01-06 DIAGNOSIS — Z7902 Long term (current) use of antithrombotics/antiplatelets: Secondary | ICD-10-CM | POA: Diagnosis not present

## 2022-01-06 MED ORDER — DOXYCYCLINE HYCLATE 100 MG PO TABS: 100.0000 mg | ORAL_TABLET | Freq: Two times a day (BID) | ORAL | 0 refills | Status: AC

## 2022-01-06 MED ORDER — LIDOCAINE HCL (PF) 1 % IJ SOLN
10.0000 mL | Freq: Once | INTRAMUSCULAR | Status: AC
  Administered 2022-01-06: 10 mL
  Filled 2022-01-06: qty 10

## 2022-01-06 NOTE — ED Triage Notes (Signed)
Pt has a laceration between left thumb and forefinger.   Pt cut self with a knife.  Bleeding controlled  pt is on plavix.  Pt alert  bleeding controlled.   ?

## 2022-01-06 NOTE — ED Provider Notes (Signed)
? ?[facility] ?Provider Note ? ?Patient Contact: 10:27 PM (approximate) ? ? ?History  ? ?Laceration ? ? ?HPI ? ?Jesus Colon is a 66 y.o. male who presents the emergency department complaining of a laceration to the left hand.  Patient was using a knife when it slipped and lacerated in the interdigital space between the thumb and the index finger.  Patient is on Plavix and aspirin due to stent placement.  Patient is able to control the bleeding with direct pressure but any movement or loosening of bandaging dislodges clot and bleeding resumes.  No loss of range of motion to any of the digits.  No loss of sensation. ?  ? ? ?Physical Exam  ? ?Triage Vital Signs: ?ED Triage Vitals [01/06/22 2102]  ?Enc Vitals Group  ?   BP (!) 141/77  ?   Pulse Rate 80  ?   Resp 18  ?   Temp 98.3 ?F (36.8 ?C)  ?   Temp Source Oral  ?   SpO2 95 %  ?   Weight 165 lb (74.8 kg)  ?   Height 5\' 6"  (1.676 m)  ?   Head Circumference   ?   Peak Flow   ?   Pain Score 5  ?   Pain Loc   ?   Pain Edu?   ?   Excl. in GC?   ? ? ?Most recent vital signs: ?Vitals:  ? 01/06/22 2102 01/06/22 2335  ?BP: (!) 141/77 138/79  ?Pulse: 80 81  ?Resp: 18 18  ?Temp: 98.3 ?F (36.8 ?C) 98.4 ?F (36.9 ?C)  ?SpO2: 95% 97%  ? ? ? ?General: Alert and in no acute distress.  ?Cardiovascular:  Good peripheral perfusion ?Respiratory: Normal respiratory effort without tachypnea or retractions. Lungs CTAB.  ?Musculoskeletal: Full range of motion to all extremities.  Patient has approximately 3 cm laceration in the interdigital space between the thumb and index finger.  With dressing, patient has no bleedthrough but with removal of dressing this dislodges the clot and the patient has slow dark bleeding consistent with venous bleeding.  No evidence of arterial bleeding.  No foreign body noted.  Full range of motion to the thumb and index finger.  Sensation intact both digits. ?Neurologic:  No gross focal neurologic deficits are appreciated.  ?Skin:    No rash noted ?Other: ? ? ?ED Results / Procedures / Treatments  ? ?Labs ?(all labs ordered are listed, but only abnormal results are displayed) ?Labs Reviewed - No data to display ? ? ?EKG ? ? ? ? ?RADIOLOGY ? ? ? ?No results found. ? ?PROCEDURES: ? ?Critical Care performed: No ? ?2336.Laceration Repair ? ?Date/Time: 01/07/2022 12:18 AM ?Performed by: 01/09/2022, PA-C ?Authorized by: Racheal Patches, PA-C  ? ?Consent:  ?  Consent obtained:  Verbal ?  Consent given by:  Patient ?  Risks discussed:  Infection, pain and poor wound healing ?Universal protocol:  ?  Procedure explained and questions answered to patient or proxy's satisfaction: yes   ?  Immediately prior to procedure, a time out was called: yes   ?  Patient identity confirmed:  Verbally with patient ?Anesthesia:  ?  Anesthesia method:  Local infiltration ?  Local anesthetic:  Lidocaine 1% w/o epi ?Laceration details:  ?  Location:  Hand ?  Hand location:  L palm ?  Length (cm):  3 ?Exploration:  ?  Hemostasis achieved with:  Direct pressure ?  Imaging outcome: foreign body  not noted   ?  Wound exploration: wound explored through full range of motion and entire depth of wound visualized   ?  Wound extent: no foreign bodies/material noted, no nerve damage noted, no tendon damage noted, no underlying fracture noted and no vascular damage noted   ?  Contaminated: no   ?Treatment:  ?  Area cleansed with:  Povidone-iodine and saline ?  Amount of cleaning:  Standard ?  Irrigation solution:  Sterile saline ?  Irrigation volume:  500 ml ?  Irrigation method:  Syringe ?Skin repair:  ?  Repair method:  Sutures ?  Suture size:  4-0 ?  Suture material:  Nylon ?  Suture technique:  Simple interrupted ?  Number of sutures:  5 ?Approximation:  ?  Approximation:  Close ?Repair type:  ?  Repair type:  Simple ?Post-procedure details:  ?  Dressing:  Sterile dressing ?  Procedure completion:  Tolerated well, no immediate complications ? ? ?MEDICATIONS ORDERED IN  ED: ?Medications  ?lidocaine (PF) (XYLOCAINE) 1 % injection 10 mL (10 mLs Infiltration Given 01/06/22 2335)  ? ? ? ?IMPRESSION / MDM / ASSESSMENT AND PLAN / ED COURSE  ?I reviewed the triage vital signs and the nursing notes. ?             ?               ? ?Differential diagnosis includes, but is not limited to, and laceration, ligament injury, retained foreign body ? ? ?Patient's diagnosis is consistent with hand laceration.  Patient presented to the emergency department after lacerating the interdigital space between the thumb and the index finger.  Patient is on both anticoagulation and antiplatelet therapy with warfarin and aspirin.  Patient did have ongoing bleeding with any movement or disruption of the clot after removing dressing the bleeding was able to be controlled direct pressure.  After suturing the laceration as described above, patient had good cessation of bleeding.  Patient has a dry dressing placed to ensure no resumption of bleeding with incidental movement during the night.  Wound care instructions discussed with the patient.  Antibiotics prophylactically.  Follow-up with primary care in 7 to 10 days for suture removal.. Patient is given ED precautions to return to the ED for any worsening or new symptoms. ? ? ? ?  ? ? ?FINAL CLINICAL IMPRESSION(S) / ED DIAGNOSES  ? ?Final diagnoses:  ?Laceration of left hand without foreign body, initial encounter  ? ? ? ?Rx / DC Orders  ? ?ED Discharge Orders   ? ?      Ordered  ?  doxycycline (VIBRA-TABS) 100 MG tablet  2 times daily       ? 01/06/22 2331  ? ?  ?  ? ?  ? ? ? ?Note:  This document was prepared using Dragon voice recognition software and may include unintentional dictation errors. ?  ?Racheal Patches, PA-C ?01/07/22 0020 ? ?  ?Arnaldo Natal, MD ?01/07/22 [phone] ? ?

## 2024-01-02 ENCOUNTER — Other Ambulatory Visit: Payer: Self-pay

## 2024-01-02 ENCOUNTER — Emergency Department

## 2024-01-02 DIAGNOSIS — M546 Pain in thoracic spine: Secondary | ICD-10-CM | POA: Insufficient documentation

## 2024-01-02 DIAGNOSIS — M545 Low back pain, unspecified: Secondary | ICD-10-CM | POA: Diagnosis present

## 2024-01-02 DIAGNOSIS — I10 Essential (primary) hypertension: Secondary | ICD-10-CM | POA: Insufficient documentation

## 2024-01-02 DIAGNOSIS — Z955 Presence of coronary angioplasty implant and graft: Secondary | ICD-10-CM | POA: Insufficient documentation

## 2024-01-02 DIAGNOSIS — R079 Chest pain, unspecified: Secondary | ICD-10-CM | POA: Diagnosis not present

## 2024-01-02 LAB — TROPONIN I (HIGH SENSITIVITY): Troponin I (High Sensitivity): 6 ng/L (ref <18)

## 2024-01-02 LAB — BASIC METABOLIC PANEL
Anion gap: 8 (ref 5–15)
BUN: 15 mg/dL (ref 8–23)
CO2: 30 mmol/L (ref 22–32)
Calcium: 9.1 mg/dL (ref 8.9–10.3)
Chloride: 99 mmol/L (ref 98–111)
Creatinine, Ser: 1.02 mg/dL (ref 0.61–1.24)
GFR, Estimated: >60 mL/min (ref >60)
Glucose, Bld: 119 mg/dL — ABNORMAL HIGH (ref 70–99)
Potassium: 4 mmol/L (ref 3.5–5.1)
Sodium: 137 mmol/L (ref 135–145)

## 2024-01-02 LAB — CBC
HCT: 45 % (ref 39.0–52.0)
Hemoglobin: 15.6 g/dL (ref 13.0–17.0)
MCH: 30.4 pg (ref 26.0–34.0)
MCHC: 34.7 g/dL (ref 30.0–36.0)
MCV: 87.7 fL (ref 80.0–100.0)
Platelets: 240 10*3/uL (ref 150–400)
RBC: 5.13 MIL/uL (ref 4.22–5.81)
RDW: 12.3 % (ref 11.5–15.5)
WBC: 7.6 10*3/uL (ref 4.0–10.5)
nRBC: 0 % (ref 0.0–0.2)

## 2024-01-02 NOTE — ED Triage Notes (Signed)
 Pt arrives with c/o mid to lower back pain that started today. Pt endorses ingestion that antacids did not help. Pt denies SOB. Pt has hx of AAA. Pt denies urinary symptoms.

## 2024-01-03 ENCOUNTER — Emergency Department
Admission: EM | Admit: 2024-01-03 | Discharge: 2024-01-03 | Disposition: A | Discharge status: Home / Self Care | Attending: Emergency Medicine | Admitting: Emergency Medicine

## 2024-01-03 ENCOUNTER — Emergency Department

## 2024-01-03 DIAGNOSIS — M545 Low back pain, unspecified: Secondary | ICD-10-CM

## 2024-01-03 DIAGNOSIS — R079 Chest pain, unspecified: Secondary | ICD-10-CM

## 2024-01-03 HISTORY — DX: Essential (primary) hypertension: I10

## 2024-01-03 HISTORY — DX: Abdominal aortic aneurysm, without rupture, unspecified: I71.40

## 2024-01-03 LAB — TROPONIN I (HIGH SENSITIVITY): Troponin I (High Sensitivity): 6 ng/L (ref <18)

## 2024-01-03 MED ORDER — OXYCODONE-ACETAMINOPHEN 5-325 MG PO TABS
1.0000 | ORAL_TABLET | Freq: Once | ORAL | Status: AC
  Administered 2024-01-03: 1 via ORAL
  Filled 2024-01-03: qty 1

## 2024-01-03 MED ORDER — IOHEXOL 350 MG/ML SOLN
100.0000 mL | Freq: Once | INTRAVENOUS | Status: AC | PRN
  Administered 2024-01-03: 100 mL via INTRAVENOUS

## 2024-01-03 NOTE — Discharge Instructions (Addendum)
 Your cardiac enzymes are negative.  Your CT scan does not show any signs of active bleeding or other acute complications that could have caused your symptoms.  Your CT does show a small "endoleak" which is stable from prior scans:  Minimal endoleak is again identified in the  juxtarenal aneurysm sac. The overall size is stable when compared  with the prior exam from 06/16/2023   You should check in with your vascular surgeon at [facility] to make sure that you do not need any sooner follow-up.  In the meantime, return to the ER for new, worsening, or persistent severe back, abdominal, or chest pain, dizziness or lightheadedness, severe elevated blood pressure, or any other new or worsening symptoms that concern you.

## 2024-01-03 NOTE — ED Provider Notes (Signed)
 [facility] Provider Note    Event Date/Time   First MD Initiated Contact with Patient 01/03/24 [phone]     (approximate)   History   Back Pain   HPI  Jesus Colon is a 68 y.o. male with history of AAA status post endovascular repair and stenting in 2021 as well as hypertension and hyperlipidemia who presents with back and chest pain, acute onset earlier in the evening and now resolved.  The patient states that he initially started having pain in his right lower back.  It spread to his upper back and shoulder blades, and then to his chest.  It has now completely resolved.  He states that it felt like indigestion.  He took Pepcid and Tums with no relief.  He states that he was belching frequently.  He denies any associated shortness of breath.  He has no abdominal pain.  He does not feel dizzy or lightheaded.  He did note his blood pressure was as high as the 180s during this episode.  He is now completely asymptomatic.  I reviewed the past medical records.  The patient was most recently seen by vascular surgery at [facility] in September of last year for routine follow-up.  At that time he was recommended for continued conservative medical management of his vascular conditions.   Physical Exam   Triage Vital Signs: ED Triage Vitals  Encounter Vitals Group     BP 01/02/24 2301 (!) 192/85     Systolic BP Percentile --      Diastolic BP Percentile --      Pulse Rate 01/02/24 2301 75     Resp 01/02/24 2301 19     Temp 01/02/24 2301 98.5 F (36.9 C)     Temp Source 01/02/24 2301 Oral     SpO2 01/02/24 2301 99 %     Weight 01/02/24 2307 165 lb (74.8 kg)     Height --      Head Circumference --      Peak Flow --      Pain Score 01/02/24 2307 8     Pain Loc --      Pain Education --      Exclude from Growth Chart --     Most recent vital signs: Vitals:   01/03/24 0408 01/03/24 0413  BP: 130/80   Pulse: 64   Resp: 16   Temp: 98.3 F (36.8 C)   SpO2: 97%  98%    General: Alert, well-appearing, no distress.  CV:  Good peripheral perfusion.  Normal heart sounds. Resp:  Normal effort.  Lungs CTAB. Abd:  Soft and nontender.  No distention.  Other:  Good peripheral perfusion.   ED Results / Procedures / Treatments   Labs (all labs ordered are listed, but only abnormal results are displayed) Labs Reviewed  BASIC METABOLIC PANEL - Abnormal; Notable for the following components:      Result Value   Glucose, Bld 119 (*)    All other components within normal limits  CBC  TROPONIN I (HIGH SENSITIVITY)  TROPONIN I (HIGH SENSITIVITY)     EKG  ED ECG REPORT I, Dionne Bucy, the attending physician, personally viewed and interpreted this ECG.  Date: 01/02/2024 EKG Time: 2304 Rate: 68 Rhythm: normal sinus rhythm QRS Axis: normal Intervals: normal ST/T Wave abnormalities: normal Narrative Interpretation: no evidence of acute ischemia    RADIOLOGY  Chest x-ray: I independently viewed and interpreted the images; there is no focal consolidation or  edema  CTA chest/abdomen/pelvis:  IMPRESSION:  CTA of the chest: No evidence of aortic aneurysmal dilatation or  dissection.    No acute abnormality noted.    CTA of the abdomen and pelvis: Changes consistent with abdominal  aortic aneurysm in a juxtarenal position with stent graft therapy  and fenestrated stents extending into the celiac, SMA and bilateral  renal arteries. Minimal endoleak is again identified in the  juxtarenal aneurysm sac. The overall size is stable when compared  with the prior exam from 06/16/2023 by report.    Cholelithiasis without complicating factors.    PROCEDURES:  Critical Care performed: No  Procedures   MEDICATIONS ORDERED IN ED: Medications  oxyCODONE-acetaminophen (PERCOCET/ROXICET) 5-325 MG per tablet 1 tablet (1 tablet Oral Given 01/03/24 0102)  iohexol (OMNIPAQUE) 350 MG/ML injection 100 mL (100 mLs Intravenous Contrast Given  01/03/24 0120)     IMPRESSION / MDM / ASSESSMENT AND PLAN / ED COURSE  I reviewed the triage vital signs and the nursing notes.  68 year old male with PMH as noted above presents with an episode of pain that started in his lower back, migrated to the upper back and then to the chest, and has now completely resolved.  On exam, he is still slightly hypertensive but other vital signs are normal.  The physical exam is unremarkable for acute findings.  Differential diagnosis includes, but is not limited to, GERD, indigestion, gas pain, musculoskeletal pain, less likely aortic aneurysm rupture or other acute complication, ACS, other cardiac etiology.  Patient's presentation is most consistent with acute complicated illness / injury requiring diagnostic workup.  The patient is on the cardiac monitor to evaluate for evidence of arrhythmia and/or significant heart rate changes.  BMP and CBC are unremarkable.  Troponins are negative x 2.  Chest x-ray shows no acute findings.  CT of the chest, abdomen, and pelvis showing minimal type II endoleak stable from prior imaging from last fall, and no acute abnormalities.  There is no indication for vascular consultation or further intervention for this finding.  At this time, given the resolved symptoms and negative workup, there is no indication for further ED workup or treatment.  The patient feels well and would like to go home.  He is stable for discharge at this time.  Overall I suspect GERD or other benign etiology.  The patient will follow-up with his vascular surgeon and primary care provider.  I gave strict return precautions and he expressed understanding.   FINAL CLINICAL IMPRESSION(S) / ED DIAGNOSES   Final diagnoses:  Acute low back pain without sciatica, unspecified back pain laterality  Chest pain, unspecified type     Rx / DC Orders   ED Discharge Orders     None        Note:  This document was prepared using Dragon voice  recognition software and may include unintentional dictation errors.    Dionne Bucy, MD 01/03/24 [phone]
